# Patient Record
Sex: Male | Born: 1995 | Race: White | Hispanic: No | Marital: Married | Smoking: Never smoker
Health system: Southern US, Community
[De-identification: ages and names within clinical notes are randomized; demographics above are authoritative.]

## PROBLEM LIST (undated history)

## (undated) HISTORY — PX: OTHER SURGICAL HISTORY: SHX169

---
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
## 2019-01-08 ENCOUNTER — Other Ambulatory Visit: Payer: Self-pay

## 2019-01-08 ENCOUNTER — Ambulatory Visit (INDEPENDENT_AMBULATORY_CARE_PROVIDER_SITE_OTHER): Payer: BC Managed Care – PPO

## 2019-01-08 ENCOUNTER — Encounter (HOSPITAL_COMMUNITY): Payer: Self-pay

## 2019-01-08 ENCOUNTER — Ambulatory Visit (HOSPITAL_COMMUNITY)
Admission: EM | Admit: 2019-01-08 | Discharge: 2019-01-08 | Disposition: A | Discharge status: Home / Self Care | Payer: BC Managed Care – PPO | Attending: Emergency Medicine | Admitting: Emergency Medicine

## 2019-01-08 DIAGNOSIS — S161XXA Strain of muscle, fascia and tendon at neck level, initial encounter: Secondary | ICD-10-CM | POA: Diagnosis not present

## 2019-01-08 MED ORDER — IBUPROFEN 800 MG PO TABS
ORAL_TABLET | ORAL | Status: AC
  Filled 2019-01-08: qty 1

## 2019-01-08 MED ORDER — METHOCARBAMOL 500 MG PO TABS: 500.0000 mg | ORAL_TABLET | Freq: Two times a day (BID) | ORAL | 0 refills | Status: DC

## 2019-01-08 MED ORDER — IBUPROFEN 800 MG PO TABS
800.0000 mg | ORAL_TABLET | Freq: Once | ORAL | Status: AC
  Administered 2019-01-08: 12:00:00 800 mg via ORAL
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
## 2019-01-08 NOTE — Discharge Instructions (Signed)
Your x-rays were normal today.   Take ibuprofen as needed for your pain.  The prescribed muscle relaxer can be taken as needed also; this medicine may make you drowsy so do not drive, operate machinery, or drink while taking it.  Return here or go to the emergency department if you develop worsening pain, shortness of breath, vomiting, dizziness, weakness, or other concerning symptoms.
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
## 2021-01-12 DIAGNOSIS — D225 Melanocytic nevi of trunk: Secondary | ICD-10-CM | POA: Diagnosis not present

## 2021-01-12 DIAGNOSIS — D224 Melanocytic nevi of scalp and neck: Secondary | ICD-10-CM | POA: Diagnosis not present

## 2021-01-12 DIAGNOSIS — B078 Other viral warts: Secondary | ICD-10-CM | POA: Diagnosis not present

## 2021-10-07 ENCOUNTER — Encounter: Payer: Self-pay | Admitting: Podiatry

## 2021-10-07 ENCOUNTER — Ambulatory Visit: Payer: BC Managed Care – PPO | Admitting: Podiatry

## 2021-10-07 DIAGNOSIS — B351 Tinea unguium: Secondary | ICD-10-CM

## 2021-10-08 NOTE — Progress Notes (Signed)
Subjective:  ? ?Patient ID: Tyler Cruz, male   DOB: 26 y.o.   MRN: [id number]  ? ?HPI ?Patient presents with caregiver concerned about nail change right over left big toe and concerned about fungus.  Patient does not smoke is active ? ? ?Review of Systems  ?All other systems reviewed and are negative. ? ? ?   ?Objective:  ?Physical Exam ?Vitals and nursing note reviewed.  ?Constitutional:   ?   Appearance: He is well-developed.  ?Pulmonary:  ?   Effort: Pulmonary effort is normal.  ?Musculoskeletal:     ?   General: Normal range of motion.  ?Skin: ?   General: Skin is warm.  ?Neurological:  ?   Mental Status: He is alert.  ?  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  Good digital perfusion well oriented x3 ? ?   ?Assessment:  ?Debility for fungus of a very mild nature but more trauma of the nailbed secondary to activity levels and it appears to be low-grade in that area ? ?   ?Plan:  ?H&P reviewed condition I do think correction is not necessary and I do recommend just watching it and if it should become more discolored or thick we will have to consider oral medicine but at this point hopefully it will grow out normally ?   ? ? ?

## 2021-12-14 ENCOUNTER — Encounter: Payer: Self-pay | Admitting: Podiatry

## 2022-01-18 DIAGNOSIS — B078 Other viral warts: Secondary | ICD-10-CM | POA: Diagnosis not present

## 2022-01-18 DIAGNOSIS — D225 Melanocytic nevi of trunk: Secondary | ICD-10-CM | POA: Diagnosis not present

## 2022-01-18 DIAGNOSIS — D2271 Melanocytic nevi of right lower limb, including hip: Secondary | ICD-10-CM | POA: Diagnosis not present

## 2022-01-18 DIAGNOSIS — D224 Melanocytic nevi of scalp and neck: Secondary | ICD-10-CM | POA: Diagnosis not present

## 2022-01-18 DIAGNOSIS — D2262 Melanocytic nevi of left upper limb, including shoulder: Secondary | ICD-10-CM | POA: Diagnosis not present

## 2022-01-27 ENCOUNTER — Encounter: Payer: Self-pay | Admitting: Podiatry

## 2022-01-27 ENCOUNTER — Ambulatory Visit: Payer: BC Managed Care – PPO | Admitting: Podiatry

## 2022-01-27 DIAGNOSIS — B351 Tinea unguium: Secondary | ICD-10-CM | POA: Diagnosis not present

## 2022-01-28 MED ORDER — TERBINAFINE HCL 250 MG PO TABS: 250.0000 mg | ORAL_TABLET | Freq: Every day | ORAL | 0 refills | Status: DC

## 2022-01-28 NOTE — Progress Notes (Signed)
Subjective:   Patient ID: Tyler Cruz, male   DOB: 25 y.o.   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  I said there is no guarantee this will get a better but they are motivated to do this understand the risk of Lamisil and oral medications want this done has had blood work with good liver function and is encouraged to call with any issues which may come up

## 2022-02-01 ENCOUNTER — Telehealth: Payer: Self-pay | Admitting: Podiatry

## 2022-02-01 NOTE — Telephone Encounter (Signed)
Patient was suppose to be prescribe Lamisil before he started his laser treatment. Please send to pharmacy

## 2022-02-03 ENCOUNTER — Other Ambulatory Visit: Payer: Self-pay | Admitting: Podiatry

## 2022-02-03 NOTE — Telephone Encounter (Signed)
I sent out 60 pills on august 3rd

## 2022-02-08 ENCOUNTER — Ambulatory Visit (INDEPENDENT_AMBULATORY_CARE_PROVIDER_SITE_OTHER): Payer: BC Managed Care – PPO | Admitting: Podiatry

## 2022-02-08 DIAGNOSIS — B351 Tinea unguium: Secondary | ICD-10-CM

## 2022-02-08 NOTE — Progress Notes (Signed)
Patient presents today for the 1st laser treatment. Diagnosed with mycotic nail infection by Dr. Charlsie Merles.    Toenail most affected 1st right.   All other systems are negative.   Nails were filed thin. Laser therapy was administered to right hallux and patient tolerated the treatment well. All safety precautions were in place.   Patient taking Lamisil as ordered by provider.   Follow up in 4 weeks for laser # 2.

## 2022-02-08 NOTE — Patient Instructions (Signed)

## 2022-03-15 ENCOUNTER — Ambulatory Visit (INDEPENDENT_AMBULATORY_CARE_PROVIDER_SITE_OTHER): Payer: BC Managed Care – PPO

## 2022-03-15 DIAGNOSIS — B351 Tinea unguium: Secondary | ICD-10-CM

## 2022-03-15 NOTE — Progress Notes (Signed)
Patient presents today for the 2nd laser treatment. Diagnosed with mycotic nail infection by Dr. Paulla Dolly.    Toenail most affected 1st right.   All other systems are negative.   Nails were filed thin. Laser therapy was administered to right hallux and patient tolerated the treatment well. All safety precautions were in place.   Patient taking Lamisil as ordered by provider.   Follow up in 4 weeks for laser # 3.

## 2022-04-15 ENCOUNTER — Ambulatory Visit: Payer: BC Managed Care – PPO | Admitting: Family Medicine

## 2022-04-22 ENCOUNTER — Ambulatory Visit (INDEPENDENT_AMBULATORY_CARE_PROVIDER_SITE_OTHER): Payer: BC Managed Care – PPO | Admitting: *Deleted

## 2022-04-22 DIAGNOSIS — B351 Tinea unguium: Secondary | ICD-10-CM

## 2022-04-22 MED ORDER — TERBINAFINE HCL 250 MG PO TABS: 250.0000 mg | ORAL_TABLET | Freq: Every day | ORAL | 0 refills | Status: DC

## 2022-04-22 NOTE — Progress Notes (Signed)
Patient presents today for the 3rd laser treatment. Diagnosed with mycotic nail infection by Dr. Paulla Dolly.    Toenail most affected 1st right, only around the medial and lateral borders of the nail.   All other systems are negative.   Nails were filed thin. Laser therapy was administered to right hallux and patient tolerated the treatment well. All safety precautions were in place.   Patient is almost finished with the lamisil initially prescribed.   Follow up in 6 weeks for laser # 4. He is going to do a 3 month wait after #4 to see how the toenail grows.    Sent #30 lamisil into pharmacy to complete the 90 day cycle.

## 2022-04-23 ENCOUNTER — Other Ambulatory Visit: Payer: BC Managed Care – PPO

## 2022-05-04 ENCOUNTER — Ambulatory Visit: Payer: BC Managed Care – PPO | Admitting: Family Medicine

## 2022-05-26 ENCOUNTER — Ambulatory Visit: Payer: BC Managed Care – PPO | Admitting: Family Medicine

## 2022-05-26 ENCOUNTER — Encounter: Payer: Self-pay | Admitting: Family Medicine

## 2022-05-26 VITALS — BP 126/82 | HR 86 | Temp 97.6°F | Ht 76.8 in | Wt 239.0 lb

## 2022-05-26 DIAGNOSIS — F988 Other specified behavioral and emotional disorders with onset usually occurring in childhood and adolescence: Secondary | ICD-10-CM | POA: Insufficient documentation

## 2022-05-26 DIAGNOSIS — Z136 Encounter for screening for cardiovascular disorders: Secondary | ICD-10-CM | POA: Diagnosis not present

## 2022-05-26 DIAGNOSIS — Z0000 Encounter for general adult medical examination without abnormal findings: Secondary | ICD-10-CM | POA: Diagnosis not present

## 2022-05-26 DIAGNOSIS — Z13818 Encounter for screening for other digestive system disorders: Secondary | ICD-10-CM | POA: Diagnosis not present

## 2022-05-26 DIAGNOSIS — Z21 Asymptomatic human immunodeficiency virus [HIV] infection status: Secondary | ICD-10-CM | POA: Diagnosis not present

## 2022-05-26 NOTE — Assessment & Plan Note (Signed)
Routine physical with non-fasting labs done today. Discussed importance of healthy diet and exercise 150 minutes per week. No concerns at this time.

## 2022-05-26 NOTE — Progress Notes (Signed)
New Patient Office Visit  Subjective    Patient ID: Tyler Cruz, male    DOB: 1995/07/29  Age: 26 y.o. MRN: [id number]  CC:  Chief Complaint  Patient presents with   Establish Care    HPI Tyler Cruz presents to establish care. Oriented to practice routines and expectations. Mr Hammitt has no significant medical history to report. He has not been seeing a PCP over the past few years. Would like a physical with labs today. Concerns include none. Has had a protein shake today but will obtain non-fasting labs today as he works 8-5 weekdays.   HIV and Hep C done today Declines STI testing Declines vaccines   Outpatient Encounter Medications as of 05/26/2022  Medication Sig   [DISCONTINUED] terbinafine (LAMISIL) 250 MG tablet Take 1 tablet (250 mg total) by mouth daily.   [DISCONTINUED] methocarbamol (ROBAXIN) 500 MG tablet Take 1 tablet (500 mg total) by mouth 2 (two) times daily. (Patient not taking: Reported on 05/26/2022)   No facility-administered encounter medications on file as of 05/26/2022.    History reviewed. No pertinent past medical history.  Past Surgical History:  Procedure Laterality Date   tonsils removed     sometime in 2003/2004   WISDOM TOOTH EXTRACTION  2014    Family History  Problem Relation Age of Onset   Healthy Mother    Healthy Father     Social History   Socioeconomic History   Marital status: Married    Spouse name: Not on file   Number of children: Not on file   Years of education: Not on file   Highest education level: Not on file  Occupational History   Not on file  Tobacco Use   Smoking status: Never   Smokeless tobacco: Never  Vaping Use   Vaping Use: Never used  Substance and Sexual Activity   Alcohol use: Not Currently   Drug use: Not on file   Sexual activity: Not on file  Other Topics Concern   Not on file  Social History Narrative   Not on file   Social Determinants of Health   Financial Resource Strain: Not on  file  Food Insecurity: Not on file  Transportation Needs: Not on file  Physical Activity: Not on file  Stress: Not on file  Social Connections: Not on file  Intimate Partner Violence: Not on file    Review of Systems  Constitutional: Negative.   HENT: Negative.    Eyes: Negative.   Respiratory: Negative.    Cardiovascular: Negative.   Gastrointestinal: Negative.   Genitourinary: Negative.   Musculoskeletal: Negative.   Skin: Negative.   Neurological: Negative.   Endo/Heme/Allergies: Negative.   Psychiatric/Behavioral: Negative.    All other systems reviewed and are negative.       Objective    BP 126/82   Pulse 86   Temp 97.6 F (36.4 C) (Oral)   Ht 6' 4.8" (1.951 m)   Wt 239 lb (108.4 kg)   SpO2 98%   BMI 28.49 kg/m   Physical Exam      Assessment & Plan:   Problem List Items Addressed This Visit       Other   Physical exam, annual - Primary    Routine physical with non-fasting labs done today. Discussed importance of healthy diet and exercise 150 minutes per week. No concerns at this time.       Relevant Orders   COMPLETE METABOLIC PANEL WITH GFR   CBC  with Differential/Platelet   Lipid panel   HIV Antibody (routine testing w rflx)   Hepatitis C antibody    Return in about 1 year (around 05/27/2023) for annual physical.   Park Meo, FNP

## 2022-05-27 LAB — CBC WITH DIFFERENTIAL/PLATELET
Absolute Monocytes: 941 cells/uL (ref 200–950)
Basophils Absolute: 58 cells/uL (ref 0–200)
Basophils Relative: 0.9 %
Eosinophils Absolute: 179 cells/uL (ref 15–500)
Eosinophils Relative: 2.8 %
HCT: 42.1 % (ref 38.5–50.0)
Hemoglobin: 14.7 g/dL (ref 13.2–17.1)
Lymphs Abs: 1312 cells/uL (ref 850–3900)
MCH: 31.1 pg (ref 27.0–33.0)
MCHC: 34.9 g/dL (ref 32.0–36.0)
MCV: 89 fL (ref 80.0–100.0)
MPV: 10.4 fL (ref 7.5–12.5)
Monocytes Relative: 14.7 %
Neutro Abs: 3910 cells/uL (ref 1500–7800)
Neutrophils Relative %: 61.1 %
Platelets: 209 10*3/uL (ref 140–400)
RBC: 4.73 10*6/uL (ref 4.20–5.80)
RDW: 11.8 % (ref 11.0–15.0)
Total Lymphocyte: 20.5 %
WBC: 6.4 10*3/uL (ref 3.8–10.8)

## 2022-05-27 LAB — LIPID PANEL
Cholesterol: 137 mg/dL (ref <200)
HDL: 44 mg/dL (ref >=40)
LDL Cholesterol (Calc): 78 mg/dL (calc)
Non-HDL Cholesterol (Calc): 93 mg/dL (calc) (ref <130)
Total CHOL/HDL Ratio: 3.1 (calc) (ref <5.0)
Triglycerides: 71 mg/dL (ref <150)

## 2022-05-27 LAB — COMPLETE METABOLIC PANEL WITH GFR
AG Ratio: 1.9 (calc) (ref 1.0–2.5)
ALT: 40 U/L (ref 9–46)
AST: 29 U/L (ref 10–40)
Albumin: 4.7 g/dL (ref 3.6–5.1)
Alkaline phosphatase (APISO): 63 U/L (ref 36–130)
BUN/Creatinine Ratio: 23 (calc) — ABNORMAL HIGH (ref 6–22)
BUN: 26 mg/dL — ABNORMAL HIGH (ref 7–25)
CO2: 28 mmol/L (ref 20–32)
Calcium: 9.7 mg/dL (ref 8.6–10.3)
Chloride: 102 mmol/L (ref 98–110)
Creat: 1.14 mg/dL (ref 0.60–1.24)
Globulin: 2.5 g/dL (calc) (ref 1.9–3.7)
Glucose, Bld: 81 mg/dL (ref 65–99)
Potassium: 4.1 mmol/L (ref 3.5–5.3)
Sodium: 139 mmol/L (ref 135–146)
Total Bilirubin: 0.6 mg/dL (ref 0.2–1.2)
Total Protein: 7.2 g/dL (ref 6.1–8.1)
eGFR: 91 mL/min/{1.73_m2} (ref >=60)

## 2022-05-27 LAB — HEPATITIS C ANTIBODY: Hepatitis C Ab: NONREACTIVE

## 2022-05-27 LAB — HIV ANTIBODY (ROUTINE TESTING W REFLEX): HIV 1&2 Ab, 4th Generation: NONREACTIVE

## 2022-06-03 ENCOUNTER — Ambulatory Visit (INDEPENDENT_AMBULATORY_CARE_PROVIDER_SITE_OTHER): Payer: BC Managed Care – PPO

## 2022-06-03 DIAGNOSIS — B351 Tinea unguium: Secondary | ICD-10-CM

## 2022-06-03 NOTE — Progress Notes (Signed)
Patient presents today for the 4th laser treatment. Diagnosed with mycotic nail infection by Dr. Charlsie Merles.    Toenail most affected 1st right, only around the medial and lateral borders of the nail.   All other systems are negative.   Nails were filed thin. Laser therapy was administered to right hallux and patient tolerated the treatment well. All safety precautions were in place.   Patient is almost finished with the lamisil initially prescribed.    We will hold off on scheduling the next laser appointment at this time. He is going to do a 3 month wait after #4 to see how the toenail grows.

## 2022-07-19 ENCOUNTER — Encounter: Payer: Self-pay | Admitting: Podiatry

## 2022-11-05 DIAGNOSIS — R040 Epistaxis: Secondary | ICD-10-CM | POA: Diagnosis not present

## 2022-11-05 DIAGNOSIS — H6121 Impacted cerumen, right ear: Secondary | ICD-10-CM | POA: Diagnosis not present

## 2022-11-15 ENCOUNTER — Ambulatory Visit: Payer: BC Managed Care – PPO | Admitting: Physician Assistant

## 2022-11-15 ENCOUNTER — Other Ambulatory Visit (INDEPENDENT_AMBULATORY_CARE_PROVIDER_SITE_OTHER): Payer: BC Managed Care – PPO

## 2022-11-15 ENCOUNTER — Encounter: Payer: Self-pay | Admitting: Physician Assistant

## 2022-11-15 DIAGNOSIS — S93402A Sprain of unspecified ligament of left ankle, initial encounter: Secondary | ICD-10-CM

## 2022-11-15 DIAGNOSIS — M25572 Pain in left ankle and joints of left foot: Secondary | ICD-10-CM | POA: Diagnosis not present

## 2022-11-15 NOTE — Progress Notes (Signed)
Office Visit Note   Patient: Tyler Cruz           Date of Birth: Feb 16, 1996           MRN: [id number] Visit Date: 11/15/2022              Requested by: Park Meo, FNP [address] [place],  [place] [address] PCP: Park Meo, FNP   Assessment & Plan: Visit Diagnoses:  1. Pain in left ankle and joints of left foot   2. Mild ankle sprain, left, initial encounter     Plan: Tyler Cruz is a pleasant 27 year old gentleman who is 2 months status post inversion ankle sprain that he sustained while he was hunting [place].  He denies any instability feelings but does still continue to have pain over the lateral ligaments when he turns in a certain way or if he fully dorsiflex his foot.  His exam is fairly benign he does have some pain with forced plantarflexion over the lateral ligaments.  He has a good endpoint on anterior draw.  He is not wearing a brace and has not really done any rehab.  I do not see anything concerning on the x-rays.  I discussed with him that it is a little unusual that he is not completely better after 2 months but that he is much better is encouraging.  Would have him do some ankle strengthening exercises.  I think it is okay to give this another month if it still not better by then he will contact me will order an MRI  Follow-Up Instructions: Return if symptoms worsen or fail to improve.   Orders:  Orders Placed This Encounter  Procedures   XR Ankle Complete Left   No orders of the defined types were placed in this encounter.     Procedures: No procedures performed   Clinical Data: No additional findings.   Subjective: Chief Complaint  Patient presents with   Left Ankle - Pain    HPI Patient is a pleasant 27 year old gentleman with mild pain to the lateral side of his left ankle.  This has been present for about 2 months.  He is 2 months status post inversion injury while he was out hunting.  Has no history of previous ankle sprain.  Does not  really have any instability.  Just has pain when he plantar flexes his foot forcefully.  Pain is focused over the lateral ligaments Review of Systems  All other systems reviewed and are negative.    Objective: Vital Signs: There were no vitals taken for this visit.  Physical Exam Constitutional:      Appearance: Normal appearance.  Skin:    General: Skin is warm and dry.  Neurological:     Mental Status: He is alert.     Ortho Exam Examination of his left ankle he has no swelling no ecchymosis.  Compartments are soft and nontender no tenderness proximally negative squeeze test.  No tenderness over the medial joint line tender over the lateral ligaments mildly tender over the peroneal tendons.  No tenderness over the distal fibula has good strength with eversion inversion and dorsiflexion.  Does have some pain with forced plantarflexion but strength is overall intact Specialty Comments:  No specialty comments available.  Imaging: XR Ankle Complete Left  Result Date: 11/15/2022 Radiographs of her left ankle were obtained today.  Well-maintained alignment through the mortise.  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 No pertinent past medical history.  Family History  Problem Relation Age of Onset   Healthy Mother    Healthy Father     Past Surgical History:  Procedure Laterality Date   tonsils removed     sometime in 2003/2004   WISDOM TOOTH EXTRACTION  2014   Social History   Occupational History   Not on file  Tobacco Use   Smoking status: Never   Smokeless tobacco: Never  Vaping Use   Vaping Use: Never used  Substance and Sexual Activity   Alcohol use: Not Currently   Drug use: Not on file    Sexual activity: Not on file

## 2023-02-07 DIAGNOSIS — D2271 Melanocytic nevi of right lower limb, including hip: Secondary | ICD-10-CM | POA: Diagnosis not present

## 2023-02-07 DIAGNOSIS — D225 Melanocytic nevi of trunk: Secondary | ICD-10-CM | POA: Diagnosis not present

## 2023-02-07 DIAGNOSIS — D2221 Melanocytic nevi of right ear and external auricular canal: Secondary | ICD-10-CM | POA: Diagnosis not present

## 2023-02-07 DIAGNOSIS — L723 Sebaceous cyst: Secondary | ICD-10-CM | POA: Diagnosis not present

## 2023-02-10 ENCOUNTER — Telehealth: Payer: Self-pay | Admitting: Physician Assistant

## 2023-02-10 DIAGNOSIS — M25572 Pain in left ankle and joints of left foot: Secondary | ICD-10-CM

## 2023-02-10 NOTE — Telephone Encounter (Signed)
Mri ordered 
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
## 2023-02-10 NOTE — Telephone Encounter (Signed)
Called and advised pt.

## 2023-02-14 ENCOUNTER — Encounter: Payer: Self-pay | Admitting: Physician Assistant

## 2023-03-15 ENCOUNTER — Ambulatory Visit
Admission: RE | Admit: 2023-03-15 | Discharge: 2023-03-15 | Disposition: A | Discharge status: Home / Self Care | Payer: BC Managed Care – PPO | Source: Ambulatory Visit | Attending: Physician Assistant | Admitting: Physician Assistant

## 2023-03-15 DIAGNOSIS — M25572 Pain in left ankle and joints of left foot: Secondary | ICD-10-CM | POA: Diagnosis not present

## 2023-03-15 DIAGNOSIS — S93492A Sprain of other ligament of left ankle, initial encounter: Secondary | ICD-10-CM | POA: Diagnosis not present

## 2023-03-15 DIAGNOSIS — M25472 Effusion, left ankle: Secondary | ICD-10-CM | POA: Diagnosis not present

## 2023-03-28 ENCOUNTER — Encounter: Payer: Self-pay | Admitting: Physician Assistant

## 2023-03-28 ENCOUNTER — Ambulatory Visit (INDEPENDENT_AMBULATORY_CARE_PROVIDER_SITE_OTHER): Payer: BC Managed Care – PPO | Admitting: Physician Assistant

## 2023-03-28 DIAGNOSIS — S93402A Sprain of unspecified ligament of left ankle, initial encounter: Secondary | ICD-10-CM

## 2023-03-28 NOTE — Progress Notes (Signed)
Office Visit Note   Patient: Tyler Cruz           Date of Birth: [birth year]           MRN: [id number] Visit Date: 03/28/2023              Requested by: Park Meo, FNP [address] [place],  [place] [address] PCP: Park Meo, FNP  Chief Complaint  Patient presents with   Left Ankle - Follow-up      HPI: Patient is a pleasant 27 year old gentleman who is approximately 6 months status post left ankle sprain.  He did this while he was hunting on uneven ground.  He has continued to have persistent symptoms of lateral pain with certain activities like twisting his ankle or sometimes standing for long periods of time.  It is focused laterally.  Because of his lack of improvement an MRI was ordered he is here to review this today.  Rates his pain as intermittent depending on his activity  Assessment & Plan: Visit Diagnoses:  1. Mild ankle sprain, left, initial encounter     Plan: Reviewed MRI with him he does have some scarring of the ATFL with a small ankle effusion.  Discussed trying some physical therapy versus a self-guided exercise program.  He would like to try this on his own and was provided information on how to do this.  Also has the option of trying an ankle injection at some point in the lateral gutter.  Could also do PRP we discussed that in detail will contact me if he wants to go forward with formal physical therapy or any of these alternate treatments  Follow-Up Instructions: Return if symptoms worsen or fail to improve.   Ortho Exam  Patient is alert, oriented, no adenopathy, well-dressed, normal affect, normal respiratory effort. Examination of his left ankle mild soft tissue swelling neurovascularly intact he has good active range of motion and good strength.  No subluxation of the peroneal tendons.  Fairly good endpoint on anterior draw tender more laterally especially with forced plantarflexion  Imaging: No results found. No images are attached to  the encounter.  Labs: No results found for: "HGBA1C", "ESRSEDRATE", "CRP", "LABURIC", "REPTSTATUS", "GRAMSTAIN", "CULT", "LABORGA"   No results found for: "ALBUMIN", "PREALBUMIN", "CBC"  No results found for: "MG" No results found for: "VD25OH"  No results found for: "PREALBUMIN"    Latest Ref Rng & Units 05/26/2022    3:23 PM  CBC EXTENDED  WBC 3.8 - 10.8 Thousand/uL 6.4   RBC 4.20 - 5.80 Million/uL 4.73   Hemoglobin 13.2 - 17.1 g/dL 19.1   HCT 47.8 - 29.5 % 42.1   Platelets 140 - 400 Thousand/uL 209   NEUT# 1,500 - 7,800 cells/uL 3,910   Lymph# 850 - 3,900 cells/uL 1,312      There is no height or weight on file to calculate BMI.  Orders:  No orders of the defined types were placed in this encounter.  No orders of the defined types were placed in this encounter.    Procedures: No procedures performed  Clinical Data: No additional findings.  ROS:  All other systems negative, except as noted in the HPI. Review of Systems  Objective: Vital Signs: There were no vitals taken for this visit.  Specialty Comments:  No specialty comments available.  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 No pertinent past medical history.  Family History  Problem Relation Age of Onset   Healthy Mother    Healthy Father     Past Surgical History:  Procedure Laterality Date   tonsils removed     sometime in 2003/2004   WISDOM TOOTH EXTRACTION  2014   Social History   Occupational History   Not on file  Tobacco Use   Smoking status: Never   Smokeless tobacco: Never  Vaping Use   Vaping status: Never Used  Substance and Sexual Activity   Alcohol use: Not Currently   Drug use: Not on file   Sexual activity: Not on file

## 2023-10-25 DIAGNOSIS — R040 Epistaxis: Secondary | ICD-10-CM | POA: Diagnosis not present

## 2024-04-11 DIAGNOSIS — D2371 Other benign neoplasm of skin of right lower limb, including hip: Secondary | ICD-10-CM | POA: Diagnosis not present

## 2024-04-11 DIAGNOSIS — D225 Melanocytic nevi of trunk: Secondary | ICD-10-CM | POA: Diagnosis not present

## 2024-04-11 DIAGNOSIS — D224 Melanocytic nevi of scalp and neck: Secondary | ICD-10-CM | POA: Diagnosis not present

## 2024-04-11 DIAGNOSIS — L812 Freckles: Secondary | ICD-10-CM | POA: Diagnosis not present
